# Patient Record
Sex: Male | Born: 1950 | Race: Asian | Hispanic: Refuse to answer | Marital: Married | Smoking: Never smoker
Health system: Southern US, Community
[De-identification: ages and names within clinical notes are randomized; demographics above are authoritative.]

---

## 1998-12-02 ENCOUNTER — Encounter: Payer: Self-pay | Admitting: Family Medicine

## 1998-12-02 ENCOUNTER — Ambulatory Visit (HOSPITAL_COMMUNITY): Admission: RE | Admit: 1998-12-02 | Discharge: 1998-12-02 | Payer: Self-pay | Admitting: Family Medicine

## 2000-06-02 ENCOUNTER — Ambulatory Visit (HOSPITAL_COMMUNITY): Admission: RE | Admit: 2000-06-02 | Discharge: 2000-06-02 | Payer: Self-pay | Admitting: Family Medicine

## 2000-06-02 ENCOUNTER — Encounter: Payer: Self-pay | Admitting: Family Medicine

## 2001-01-26 ENCOUNTER — Encounter: Payer: Self-pay | Admitting: Orthopedic Surgery

## 2001-01-26 ENCOUNTER — Ambulatory Visit (HOSPITAL_COMMUNITY): Admission: RE | Admit: 2001-01-26 | Discharge: 2001-01-26 | Payer: Self-pay | Admitting: Orthopedic Surgery

## 2001-02-24 ENCOUNTER — Ambulatory Visit (HOSPITAL_COMMUNITY): Admission: RE | Admit: 2001-02-24 | Discharge: 2001-02-24 | Payer: Self-pay | Admitting: Orthopedic Surgery

## 2002-06-15 ENCOUNTER — Ambulatory Visit (HOSPITAL_COMMUNITY): Admission: RE | Admit: 2002-06-15 | Discharge: 2002-06-15 | Payer: Self-pay | Admitting: Gastroenterology

## 2002-12-20 ENCOUNTER — Encounter: Admission: RE | Admit: 2002-12-20 | Discharge: 2002-12-20 | Payer: Self-pay | Admitting: Family Medicine

## 2002-12-20 ENCOUNTER — Encounter: Payer: Self-pay | Admitting: Family Medicine

## 2003-01-11 ENCOUNTER — Ambulatory Visit (HOSPITAL_COMMUNITY): Admission: RE | Admit: 2003-01-11 | Discharge: 2003-01-11 | Payer: Self-pay | Admitting: Family Medicine

## 2003-01-11 ENCOUNTER — Encounter: Payer: Self-pay | Admitting: Family Medicine

## 2004-08-25 ENCOUNTER — Observation Stay (HOSPITAL_COMMUNITY): Admission: EM | Admit: 2004-08-25 | Discharge: 2004-08-26 | Payer: Self-pay | Admitting: Emergency Medicine

## 2006-03-23 ENCOUNTER — Ambulatory Visit: Payer: Self-pay | Admitting: Gastroenterology

## 2006-03-26 ENCOUNTER — Ambulatory Visit: Payer: Self-pay | Admitting: Gastroenterology

## 2012-10-27 ENCOUNTER — Ambulatory Visit: Payer: Self-pay

## 2012-10-27 ENCOUNTER — Other Ambulatory Visit: Payer: Self-pay | Admitting: Occupational Medicine

## 2012-10-27 DIAGNOSIS — R52 Pain, unspecified: Secondary | ICD-10-CM

## 2012-11-30 ENCOUNTER — Other Ambulatory Visit: Payer: Self-pay | Admitting: Physician Assistant

## 2012-11-30 ENCOUNTER — Ambulatory Visit
Admission: RE | Admit: 2012-11-30 | Discharge: 2012-11-30 | Disposition: A | Discharge status: Home / Self Care | Payer: Managed Care, Other (non HMO) | Source: Ambulatory Visit | Attending: Physician Assistant | Admitting: Physician Assistant

## 2012-11-30 DIAGNOSIS — R05 Cough: Secondary | ICD-10-CM

## 2013-10-23 ENCOUNTER — Emergency Department (INDEPENDENT_AMBULATORY_CARE_PROVIDER_SITE_OTHER)
Admission: EM | Admit: 2013-10-23 | Discharge: 2013-10-23 | Disposition: A | Discharge status: Home / Self Care | Payer: Worker's Compensation | Source: Home / Self Care | Attending: Family Medicine | Admitting: Family Medicine

## 2013-10-23 ENCOUNTER — Emergency Department (INDEPENDENT_AMBULATORY_CARE_PROVIDER_SITE_OTHER): Payer: Worker's Compensation

## 2013-10-23 ENCOUNTER — Encounter (HOSPITAL_COMMUNITY): Payer: Self-pay | Admitting: Emergency Medicine

## 2013-10-23 DIAGNOSIS — T148XXA Other injury of unspecified body region, initial encounter: Secondary | ICD-10-CM

## 2013-10-23 DIAGNOSIS — W19XXXA Unspecified fall, initial encounter: Secondary | ICD-10-CM

## 2013-10-23 DIAGNOSIS — IMO0002 Reserved for concepts with insufficient information to code with codable children: Secondary | ICD-10-CM

## 2013-10-23 MED ORDER — NAPROXEN 375 MG PO TABS: 375.0000 mg | ORAL_TABLET | Freq: Two times a day (BID) | ORAL | Status: AC

## 2013-10-23 NOTE — Discharge Instructions (Signed)
Contusion A contusion is a deep bruise. Contusions happen when an injury causes bleeding under the skin. Signs of bruising include pain, puffiness (swelling), and discolored skin. The contusion may turn blue, purple, or yellow. HOME CARE   Put ice on the injured area.  Put ice in a plastic bag.  Place a towel between your skin and the bag.  Leave the ice on for 15-20 minutes, 03-04 times a day.  Only take medicine as told by your doctor.  Rest the injured area.  If possible, raise (elevate) the injured area to lessen puffiness. GET HELP RIGHT AWAY IF:   You have more bruising or puffiness.  You have pain that is getting worse.  Your puffiness or pain is not helped by medicine. MAKE SURE YOU:   Understand these instructions.  Will watch your condition.  Will get help right away if you are not doing well or get worse. Document Released: 03/09/2008 Document Revised: 12/14/2011 Document Reviewed: 07/27/2011 [facility] Patient Information 2014 [place], [place].  Fall Prevention and Home Safety Falls cause injuries and can affect all age groups. It is possible to prevent falls.  HOW TO PREVENT FALLS  Wear shoes with rubber soles that do not have an opening for your toes.  Keep the inside and outside of your house well lit.  Use night lights throughout your home.  Remove clutter from floors.  Clean up floor spills.  Remove throw rugs or fasten them to the floor with carpet tape.  Do not place electrical cords across pathways.  Put grab bars by your tub, shower, and toilet. Do not use towel bars as grab bars.  Put handrails on both sides of the stairway. Fix loose handrails.  Do not climb on stools or stepladders, if possible.  Do not wax your floors.  Repair uneven or unsafe sidewalks, walkways, or stairs.  Keep items you use a lot within reach.  Be aware of pets.  Keep emergency numbers next to the telephone.  Put smoke detectors in your home and near  bedrooms. Ask your doctor what other things you can do to prevent falls. Document Released: 07/18/2009 Document Revised: 03/22/2012 Document Reviewed: 12/22/2011 [facility] Patient Information 2014 [place], [place].

## 2013-10-23 NOTE — ED Provider Notes (Signed)
CSN: [id number]     Arrival date & time 10/23/13  1912 History   None    Chief Complaint  Patient presents with  . Fall    back injury   (Consider location/radiation/quality/duration/timing/severity/associated sxs/prior Treatment) HPI Comments: Pt fell about 2-2.5 feet off the ground while on ladder changing light bulb at work. States he lost his balance and that this has never happened before. Denies hitting head or LOC. Fell backwards landing on R side of thoracic back and L wrist/forearm.   Patient is a 63 y.o. male presenting with fall. The history is provided by the patient.  Fall This is a new problem. The current episode started 3 to 5 hours ago. Episode frequency: first fall  The problem has not changed since onset.Pertinent negatives include no chest pain, no abdominal pain, no headaches and no shortness of breath. Nothing aggravates the symptoms. Nothing relieves the symptoms. Treatments tried: cleaned abrasions and bandaged them.  The treatment provided mild relief.    History reviewed. No pertinent past medical history. History reviewed. No pertinent past surgical history. History reviewed. No pertinent family history. History  Substance Use Topics  . Smoking status: Never Smoker   . Smokeless tobacco: Not on file  . Alcohol Use: No    Review of Systems  Respiratory: Negative for shortness of breath.   Cardiovascular: Negative for chest pain.  Gastrointestinal: Negative for abdominal pain.  Musculoskeletal:       R thoracic back pain, L neck pain  Skin: Positive for wound.  Neurological: Negative for dizziness and headaches.    Allergies  Review of patient's allergies indicates no known allergies.  Home Medications   Current Outpatient Rx  Name  Route  Sig  Dispense  Refill  . Cyanocobalamin (VITAMIN B 12 PO)   Oral   Take by mouth.         . omega-3 acid ethyl esters (LOVAZA) 1 G capsule   Oral   Take by mouth 2 (two) times daily.         . naproxen  (NAPROSYN) 375 MG tablet   Oral   Take 1 tablet (375 mg total) by mouth 2 (two) times daily.   20 tablet   0    BP 139/93  Pulse 90  Temp(Src) 98.1 F (36.7 C) (Oral)  Resp 18  SpO2 96% Physical Exam  Constitutional: He appears well-developed and well-nourished. No distress.  Cardiovascular: Normal rate and regular rhythm.   Pulmonary/Chest: Effort normal and breath sounds normal.  Musculoskeletal:       Cervical back: He exhibits tenderness. He exhibits normal range of motion, no bony tenderness and no deformity.       Thoracic back: He exhibits bony tenderness. He exhibits normal range of motion, no tenderness and no deformity.       Back:  Skin: Skin is warm and dry. Abrasion noted.  Abrasions to L palm, forearm and elbow    ED Course  Procedures (including critical care time) Labs Review Labs Reviewed - No data to display Imaging Review Dg Ribs Unilateral W/chest Right  10/23/2013   CLINICAL DATA:  Chest pain after a fall.  EXAM: RIGHT RIBS AND CHEST - 3+ VIEW  COMPARISON:  PA and lateral chest 11/30/2012.  FINDINGS: Lungs are clear. No pneumothorax or pleural fluid. Heart size is normal. No fracture is identified.  IMPRESSION: Negative exam.   Electronically Signed   By: Drusilla Kannerhomas  Dalessio M.D.   On: 10/23/2013 21:07  EKG Interpretation    Date/Time:    Ventricular Rate:    PR Interval:    QRS Duration:   QT Interval:    QTC Calculation:   R Axis:     Text Interpretation:              MDM   1. Contusion   2. Abrasion   3. Fall   rx naproxen 375mg  BID #20.      Cathlyn Parsons, NP 10/23/13 2121

## 2013-10-23 NOTE — ED Notes (Signed)
Pt reports while at work today changing a light bulb the ladder he was standing on slid from underneath him causing him to fall injury his back.  Pt is c/o sharp pain with movement and discomfort in neck.   Incident happened around 3 or 4 p.m today.

## 2013-10-24 NOTE — ED Provider Notes (Signed)
Medical screening examination/treatment/procedure(s) were performed by resident physician or non-physician practitioner and as supervising physician I was immediately available for consultation/collaboration.   Dela Sweeny DOUGLAS MD.   Ervan Heber D Nakeda Lebron, MD 10/24/13 1409 

## 2016-01-19 IMAGING — CR DG RIBS W/ CHEST 3+V*R*
5 series · 5 of 5 positions shown · non-contrast
Comparison: PA and lateral chest 11/30/2012.

CLINICAL DATA: Chest pain after a fall.

EXAM:
RIGHT RIBS AND CHEST - 3+ VIEW

[view not recorded (1 of 5)]
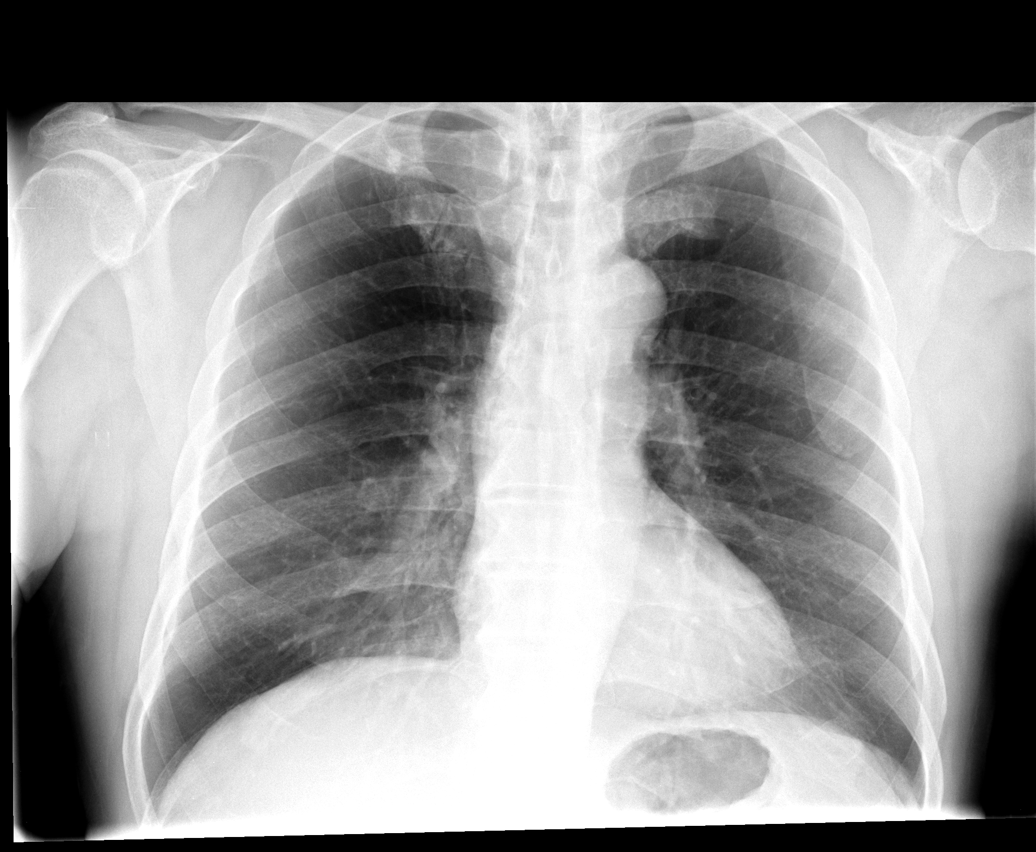

[view not recorded (2 of 5)]
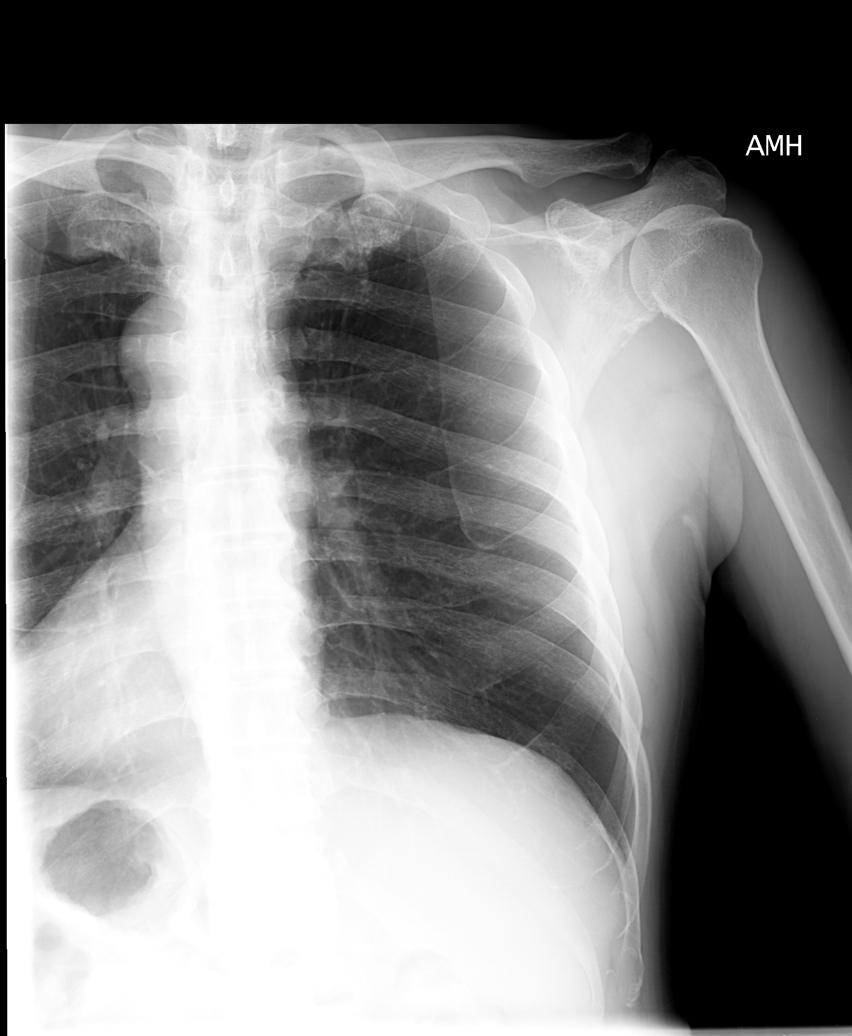

[view not recorded (3 of 5)]
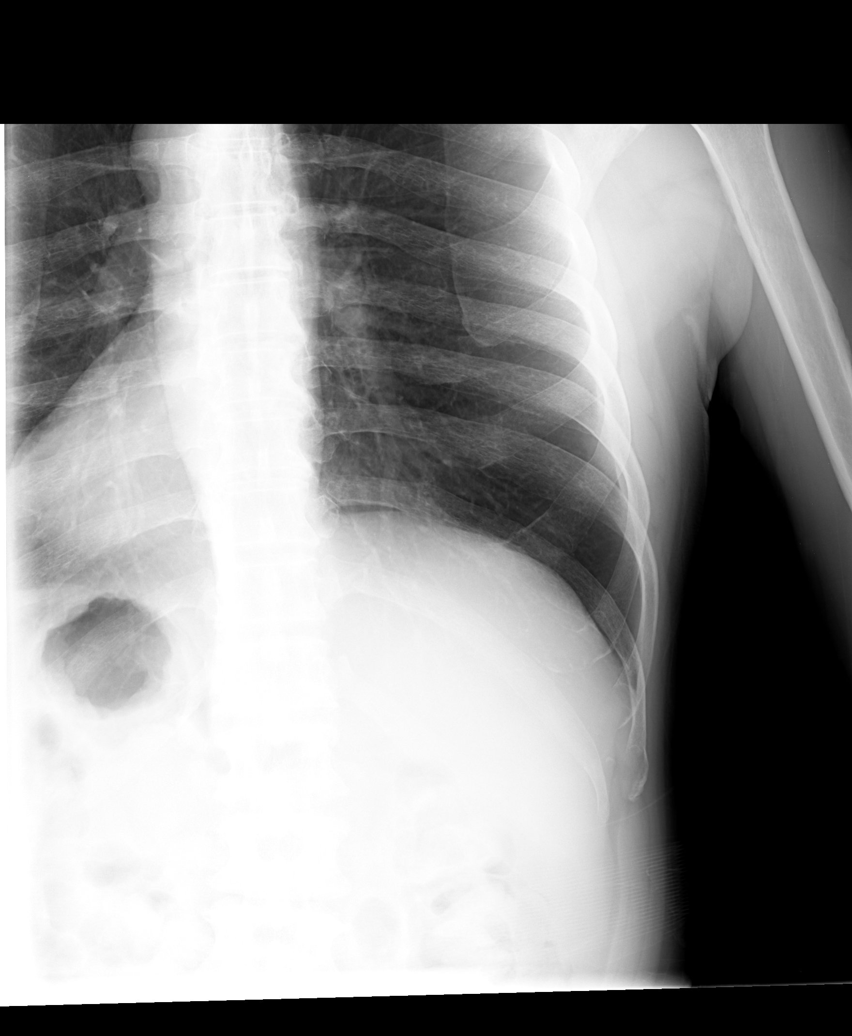

[view not recorded (4 of 5)]
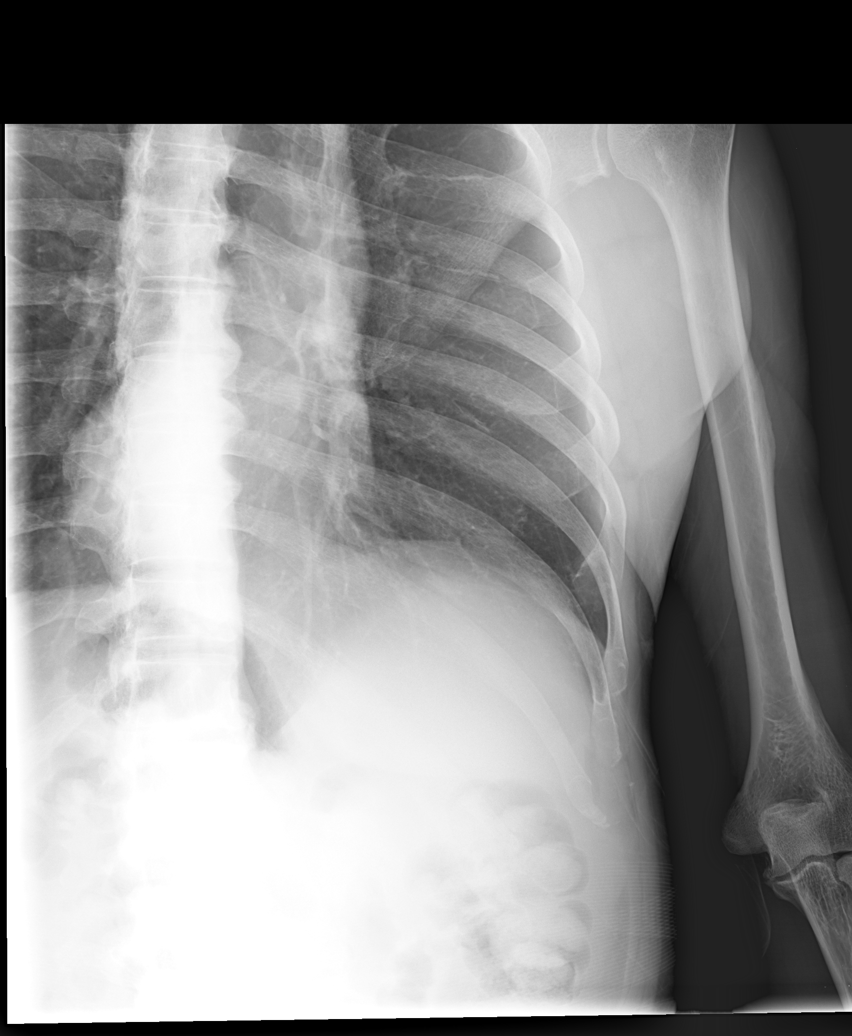

[view not recorded (5 of 5)]
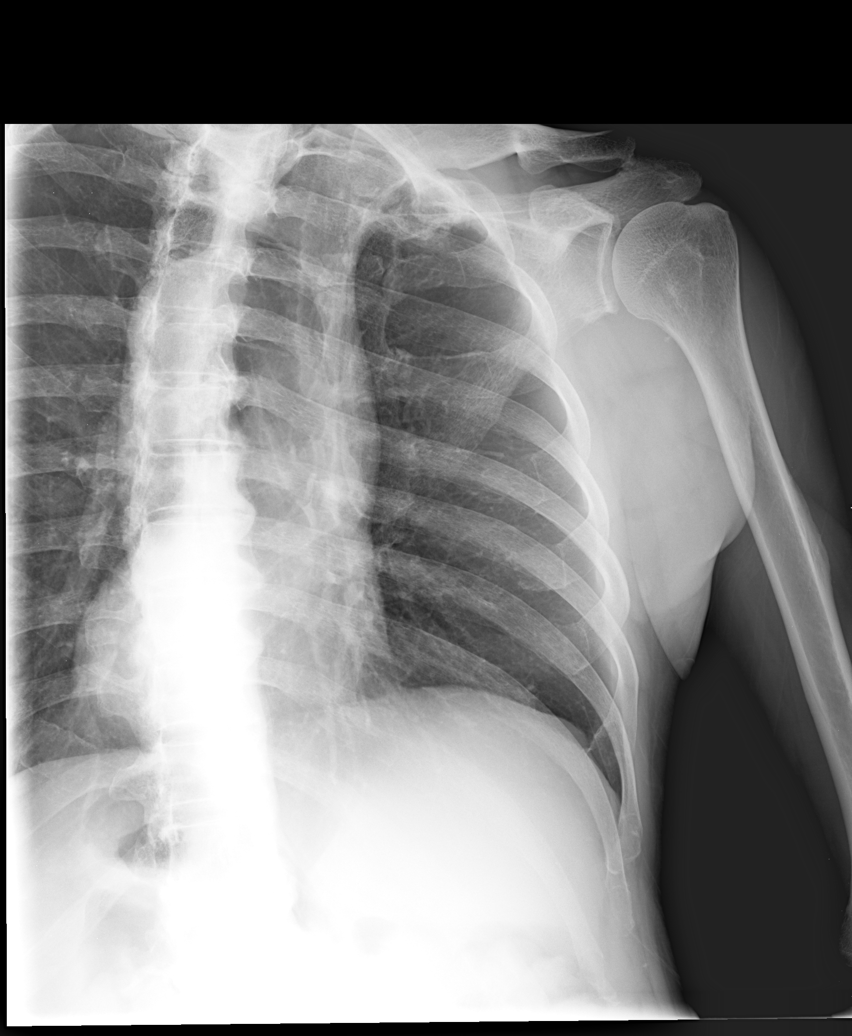

[5 of 5 positions shown; findings below may reference images not displayed]

FINDINGS: Lungs are clear. No pneumothorax or pleural fluid. Heart size is
normal. No fracture is identified.
IMPRESSION: Negative exam.

## 2016-02-18 ENCOUNTER — Encounter: Payer: Self-pay | Admitting: Gastroenterology

## 2019-10-09 DIAGNOSIS — I10 Essential (primary) hypertension: Secondary | ICD-10-CM | POA: Diagnosis not present

## 2019-10-09 DIAGNOSIS — J309 Allergic rhinitis, unspecified: Secondary | ICD-10-CM | POA: Diagnosis not present

## 2020-01-24 DIAGNOSIS — L309 Dermatitis, unspecified: Secondary | ICD-10-CM | POA: Diagnosis not present

## 2020-01-24 DIAGNOSIS — J309 Allergic rhinitis, unspecified: Secondary | ICD-10-CM | POA: Diagnosis not present

## 2020-01-24 DIAGNOSIS — I10 Essential (primary) hypertension: Secondary | ICD-10-CM | POA: Diagnosis not present

## 2020-01-24 DIAGNOSIS — Z125 Encounter for screening for malignant neoplasm of prostate: Secondary | ICD-10-CM | POA: Diagnosis not present

## 2020-01-24 DIAGNOSIS — Z1211 Encounter for screening for malignant neoplasm of colon: Secondary | ICD-10-CM | POA: Diagnosis not present

## 2020-01-24 DIAGNOSIS — Z0000 Encounter for general adult medical examination without abnormal findings: Secondary | ICD-10-CM | POA: Diagnosis not present

## 2020-01-29 DIAGNOSIS — Z1211 Encounter for screening for malignant neoplasm of colon: Secondary | ICD-10-CM | POA: Diagnosis not present

## 2020-04-30 DIAGNOSIS — R946 Abnormal results of thyroid function studies: Secondary | ICD-10-CM | POA: Diagnosis not present

## 2020-06-14 DIAGNOSIS — E039 Hypothyroidism, unspecified: Secondary | ICD-10-CM | POA: Diagnosis not present

## 2020-11-26 DIAGNOSIS — L821 Other seborrheic keratosis: Secondary | ICD-10-CM | POA: Diagnosis not present

## 2020-11-26 DIAGNOSIS — L308 Other specified dermatitis: Secondary | ICD-10-CM | POA: Diagnosis not present

## 2021-03-21 DIAGNOSIS — H01004 Unspecified blepharitis left upper eyelid: Secondary | ICD-10-CM | POA: Diagnosis not present

## 2021-03-21 DIAGNOSIS — R059 Cough, unspecified: Secondary | ICD-10-CM | POA: Diagnosis not present

## 2021-03-21 DIAGNOSIS — L309 Dermatitis, unspecified: Secondary | ICD-10-CM | POA: Diagnosis not present

## 2021-04-17 DIAGNOSIS — Z125 Encounter for screening for malignant neoplasm of prostate: Secondary | ICD-10-CM | POA: Diagnosis not present

## 2021-04-17 DIAGNOSIS — I10 Essential (primary) hypertension: Secondary | ICD-10-CM | POA: Diagnosis not present

## 2021-04-17 DIAGNOSIS — L309 Dermatitis, unspecified: Secondary | ICD-10-CM | POA: Diagnosis not present

## 2021-04-17 DIAGNOSIS — J309 Allergic rhinitis, unspecified: Secondary | ICD-10-CM | POA: Diagnosis not present

## 2021-04-17 DIAGNOSIS — Z0000 Encounter for general adult medical examination without abnormal findings: Secondary | ICD-10-CM | POA: Diagnosis not present

## 2021-04-17 DIAGNOSIS — Z1211 Encounter for screening for malignant neoplasm of colon: Secondary | ICD-10-CM | POA: Diagnosis not present

## 2021-04-18 DIAGNOSIS — Z1211 Encounter for screening for malignant neoplasm of colon: Secondary | ICD-10-CM | POA: Diagnosis not present

## 2022-01-01 DIAGNOSIS — H5212 Myopia, left eye: Secondary | ICD-10-CM | POA: Diagnosis not present

## 2022-04-30 DIAGNOSIS — Z1211 Encounter for screening for malignant neoplasm of colon: Secondary | ICD-10-CM | POA: Diagnosis not present

## 2022-04-30 DIAGNOSIS — L309 Dermatitis, unspecified: Secondary | ICD-10-CM | POA: Diagnosis not present

## 2022-04-30 DIAGNOSIS — Z0000 Encounter for general adult medical examination without abnormal findings: Secondary | ICD-10-CM | POA: Diagnosis not present

## 2022-04-30 DIAGNOSIS — I10 Essential (primary) hypertension: Secondary | ICD-10-CM | POA: Diagnosis not present

## 2022-04-30 DIAGNOSIS — Z125 Encounter for screening for malignant neoplasm of prostate: Secondary | ICD-10-CM | POA: Diagnosis not present

## 2022-04-30 DIAGNOSIS — R946 Abnormal results of thyroid function studies: Secondary | ICD-10-CM | POA: Diagnosis not present

## 2022-05-08 DIAGNOSIS — Z1211 Encounter for screening for malignant neoplasm of colon: Secondary | ICD-10-CM | POA: Diagnosis not present

## 2022-09-01 DIAGNOSIS — R198 Other specified symptoms and signs involving the digestive system and abdomen: Secondary | ICD-10-CM | POA: Diagnosis not present

## 2022-09-01 DIAGNOSIS — R946 Abnormal results of thyroid function studies: Secondary | ICD-10-CM | POA: Diagnosis not present

## 2022-10-14 ENCOUNTER — Other Ambulatory Visit: Payer: Self-pay | Admitting: Family Medicine

## 2022-10-14 DIAGNOSIS — R198 Other specified symptoms and signs involving the digestive system and abdomen: Secondary | ICD-10-CM | POA: Diagnosis not present

## 2022-11-16 ENCOUNTER — Encounter: Payer: Self-pay | Admitting: Family Medicine

## 2022-11-16 ENCOUNTER — Other Ambulatory Visit: Payer: Managed Care, Other (non HMO)

## 2022-11-20 DIAGNOSIS — R140 Abdominal distension (gaseous): Secondary | ICD-10-CM | POA: Diagnosis not present

## 2022-11-20 DIAGNOSIS — R1013 Epigastric pain: Secondary | ICD-10-CM | POA: Diagnosis not present

## 2022-12-03 ENCOUNTER — Other Ambulatory Visit: Payer: Self-pay | Admitting: Physician Assistant

## 2022-12-03 DIAGNOSIS — K259 Gastric ulcer, unspecified as acute or chronic, without hemorrhage or perforation: Secondary | ICD-10-CM | POA: Diagnosis not present

## 2022-12-03 DIAGNOSIS — R1013 Epigastric pain: Secondary | ICD-10-CM | POA: Diagnosis not present

## 2022-12-03 DIAGNOSIS — K319 Disease of stomach and duodenum, unspecified: Secondary | ICD-10-CM | POA: Diagnosis not present

## 2022-12-03 MED ORDER — PREDNISONE 10 MG (21) PO TBPK: ORAL_TABLET | ORAL | 0 refills | Status: AC

## 2022-12-04 ENCOUNTER — Ambulatory Visit
Admission: RE | Admit: 2022-12-04 | Discharge: 2022-12-04 | Disposition: A | Discharge status: Home / Self Care | Payer: Medicare HMO | Source: Ambulatory Visit | Attending: Family Medicine | Admitting: Family Medicine

## 2022-12-04 DIAGNOSIS — R198 Other specified symptoms and signs involving the digestive system and abdomen: Secondary | ICD-10-CM

## 2022-12-04 DIAGNOSIS — K824 Cholesterolosis of gallbladder: Secondary | ICD-10-CM | POA: Diagnosis not present

## 2022-12-04 DIAGNOSIS — K769 Liver disease, unspecified: Secondary | ICD-10-CM | POA: Diagnosis not present

## 2022-12-07 DIAGNOSIS — K319 Disease of stomach and duodenum, unspecified: Secondary | ICD-10-CM | POA: Diagnosis not present

## 2022-12-28 DIAGNOSIS — H52221 Regular astigmatism, right eye: Secondary | ICD-10-CM | POA: Diagnosis not present

## 2022-12-28 DIAGNOSIS — H5212 Myopia, left eye: Secondary | ICD-10-CM | POA: Diagnosis not present

## 2022-12-28 DIAGNOSIS — H2513 Age-related nuclear cataract, bilateral: Secondary | ICD-10-CM | POA: Diagnosis not present

## 2022-12-28 DIAGNOSIS — H04123 Dry eye syndrome of bilateral lacrimal glands: Secondary | ICD-10-CM | POA: Diagnosis not present

## 2022-12-28 DIAGNOSIS — H3530 Unspecified macular degeneration: Secondary | ICD-10-CM | POA: Diagnosis not present

## 2022-12-28 DIAGNOSIS — H5201 Hypermetropia, right eye: Secondary | ICD-10-CM | POA: Diagnosis not present

## 2022-12-28 DIAGNOSIS — H524 Presbyopia: Secondary | ICD-10-CM | POA: Diagnosis not present

## 2022-12-28 DIAGNOSIS — H1045 Other chronic allergic conjunctivitis: Secondary | ICD-10-CM | POA: Diagnosis not present

## 2023-03-12 ENCOUNTER — Other Ambulatory Visit: Payer: Self-pay | Admitting: Family Medicine

## 2023-03-12 DIAGNOSIS — K7689 Other specified diseases of liver: Secondary | ICD-10-CM

## 2023-04-13 ENCOUNTER — Ambulatory Visit
Admission: RE | Admit: 2023-04-13 | Discharge: 2023-04-13 | Disposition: A | Discharge status: Home / Self Care | Payer: Medicare HMO | Source: Ambulatory Visit | Attending: Family Medicine | Admitting: Family Medicine

## 2023-04-13 DIAGNOSIS — R160 Hepatomegaly, not elsewhere classified: Secondary | ICD-10-CM | POA: Diagnosis not present

## 2023-04-13 DIAGNOSIS — K760 Fatty (change of) liver, not elsewhere classified: Secondary | ICD-10-CM | POA: Diagnosis not present

## 2023-04-13 DIAGNOSIS — K7689 Other specified diseases of liver: Secondary | ICD-10-CM

## 2023-05-11 DIAGNOSIS — I10 Essential (primary) hypertension: Secondary | ICD-10-CM | POA: Diagnosis not present

## 2023-05-11 DIAGNOSIS — Z0000 Encounter for general adult medical examination without abnormal findings: Secondary | ICD-10-CM | POA: Diagnosis not present

## 2023-05-11 DIAGNOSIS — Z1211 Encounter for screening for malignant neoplasm of colon: Secondary | ICD-10-CM | POA: Diagnosis not present

## 2023-05-11 DIAGNOSIS — L309 Dermatitis, unspecified: Secondary | ICD-10-CM | POA: Diagnosis not present

## 2023-05-11 DIAGNOSIS — K760 Fatty (change of) liver, not elsewhere classified: Secondary | ICD-10-CM | POA: Diagnosis not present

## 2023-05-11 DIAGNOSIS — K7689 Other specified diseases of liver: Secondary | ICD-10-CM | POA: Diagnosis not present

## 2023-05-11 DIAGNOSIS — Z125 Encounter for screening for malignant neoplasm of prostate: Secondary | ICD-10-CM | POA: Diagnosis not present

## 2023-10-11 ENCOUNTER — Other Ambulatory Visit: Payer: Self-pay | Admitting: Family Medicine

## 2023-10-11 DIAGNOSIS — R160 Hepatomegaly, not elsewhere classified: Secondary | ICD-10-CM

## 2023-10-19 ENCOUNTER — Ambulatory Visit
Admission: RE | Admit: 2023-10-19 | Discharge: 2023-10-19 | Disposition: A | Discharge status: Home / Self Care | Payer: Medicare (Managed Care) | Source: Ambulatory Visit | Attending: Family Medicine | Admitting: Family Medicine

## 2023-10-19 DIAGNOSIS — R160 Hepatomegaly, not elsewhere classified: Secondary | ICD-10-CM

## 2023-10-28 ENCOUNTER — Other Ambulatory Visit: Payer: Self-pay | Admitting: Family Medicine

## 2023-10-28 DIAGNOSIS — K769 Liver disease, unspecified: Secondary | ICD-10-CM

## 2023-11-19 ENCOUNTER — Ambulatory Visit
Admission: RE | Admit: 2023-11-19 | Discharge: 2023-11-19 | Disposition: A | Discharge status: Home / Self Care | Payer: Medicare (Managed Care) | Source: Ambulatory Visit | Attending: Family Medicine | Admitting: Family Medicine

## 2023-11-19 DIAGNOSIS — K769 Liver disease, unspecified: Secondary | ICD-10-CM

## 2023-11-19 DIAGNOSIS — D1803 Hemangioma of intra-abdominal structures: Secondary | ICD-10-CM | POA: Diagnosis not present

## 2023-11-19 MED ORDER — GADOPICLENOL 0.5 MMOL/ML IV SOLN
7.0000 mL | Freq: Once | INTRAVENOUS | Status: AC | PRN
  Administered 2023-11-19: 7 mL via INTRAVENOUS

## 2023-11-26 ENCOUNTER — Other Ambulatory Visit: Payer: Self-pay

## 2023-11-26 ENCOUNTER — Other Ambulatory Visit (HOSPITAL_BASED_OUTPATIENT_CLINIC_OR_DEPARTMENT_OTHER): Payer: Self-pay

## 2023-11-26 MED ORDER — AMLODIPINE BESYLATE 2.5 MG PO TABS
2.5000 mg | ORAL_TABLET | Freq: Every day | ORAL | 4 refills | Status: DC
  Filled 2023-11-26 – 2024-01-05 (×2): qty 90, 90d supply, fill #0
  Filled 2024-04-17: qty 90, 90d supply, fill #1

## 2023-11-26 MED ORDER — TRIAMCINOLONE ACETONIDE 0.1 % EX CREA
1.0000 | TOPICAL_CREAM | Freq: Two times a day (BID) | CUTANEOUS | 1 refills | Status: AC
  Filled 2023-11-26: qty 60, 30d supply, fill #0

## 2023-12-06 ENCOUNTER — Other Ambulatory Visit (HOSPITAL_BASED_OUTPATIENT_CLINIC_OR_DEPARTMENT_OTHER): Payer: Self-pay

## 2024-01-05 ENCOUNTER — Other Ambulatory Visit (HOSPITAL_BASED_OUTPATIENT_CLINIC_OR_DEPARTMENT_OTHER): Payer: Self-pay

## 2024-01-06 ENCOUNTER — Other Ambulatory Visit (HOSPITAL_BASED_OUTPATIENT_CLINIC_OR_DEPARTMENT_OTHER): Payer: Self-pay

## 2024-04-17 ENCOUNTER — Other Ambulatory Visit (HOSPITAL_BASED_OUTPATIENT_CLINIC_OR_DEPARTMENT_OTHER): Payer: Self-pay

## 2024-05-23 ENCOUNTER — Other Ambulatory Visit (HOSPITAL_BASED_OUTPATIENT_CLINIC_OR_DEPARTMENT_OTHER): Payer: Self-pay

## 2024-05-23 ENCOUNTER — Other Ambulatory Visit: Payer: Self-pay

## 2024-05-23 MED ORDER — AMLODIPINE BESYLATE 2.5 MG PO TABS
2.5000 mg | ORAL_TABLET | Freq: Every day | ORAL | 3 refills | Status: AC
  Filled 2024-05-23 – 2024-07-10 (×2): qty 90, 90d supply, fill #0

## 2024-05-23 MED ORDER — BETAMETHASONE DIPROPIONATE 0.05 % EX OINT
TOPICAL_OINTMENT | CUTANEOUS | 1 refills | Status: AC
  Filled 2024-05-23: qty 30, 30d supply, fill #0

## 2024-05-24 ENCOUNTER — Other Ambulatory Visit (HOSPITAL_BASED_OUTPATIENT_CLINIC_OR_DEPARTMENT_OTHER): Payer: Self-pay

## 2024-07-10 ENCOUNTER — Other Ambulatory Visit (HOSPITAL_BASED_OUTPATIENT_CLINIC_OR_DEPARTMENT_OTHER): Payer: Self-pay

## 2024-07-25 DIAGNOSIS — H524 Presbyopia: Secondary | ICD-10-CM | POA: Diagnosis not present

## 2024-09-21 ENCOUNTER — Other Ambulatory Visit (HOSPITAL_BASED_OUTPATIENT_CLINIC_OR_DEPARTMENT_OTHER): Payer: Self-pay

## 2024-09-21 MED ORDER — AMLODIPINE BESYLATE 5 MG PO TABS
5.0000 mg | ORAL_TABLET | Freq: Every day | ORAL | 0 refills | Status: DC
  Filled 2024-09-21: qty 30, 30d supply, fill #0

## 2024-09-21 MED ORDER — MECLIZINE HCL 12.5 MG PO TABS
12.5000 mg | ORAL_TABLET | Freq: Two times a day (BID) | ORAL | 0 refills | Status: AC | PRN
  Filled 2024-09-21: qty 14, 7d supply, fill #0

## 2024-10-13 ENCOUNTER — Other Ambulatory Visit (HOSPITAL_BASED_OUTPATIENT_CLINIC_OR_DEPARTMENT_OTHER): Payer: Self-pay

## 2024-10-13 MED ORDER — AMLODIPINE BESYLATE 2.5 MG PO TABS
2.5000 mg | ORAL_TABLET | Freq: Every day | ORAL | 1 refills | Status: AC
  Filled 2024-10-13: qty 90, 90d supply, fill #0

## 2024-10-13 MED ORDER — AMLODIPINE BESYLATE 5 MG PO TABS
5.0000 mg | ORAL_TABLET | Freq: Every day | ORAL | 1 refills | Status: AC
  Filled 2024-10-13: qty 90, 90d supply, fill #0

## 2024-10-18 ENCOUNTER — Other Ambulatory Visit: Payer: Self-pay
